# Patient Record
Sex: Male | Born: 1960 | Race: White | Hispanic: No | Marital: Married | Smoking: Former smoker
Health system: Southern US, Community
[De-identification: ages and names within clinical notes are randomized; demographics above are authoritative.]

## PROBLEM LIST (undated history)

## (undated) DIAGNOSIS — I10 Essential (primary) hypertension: Secondary | ICD-10-CM

## (undated) DIAGNOSIS — M109 Gout, unspecified: Secondary | ICD-10-CM

## (undated) DIAGNOSIS — I2510 Atherosclerotic heart disease of native coronary artery without angina pectoris: Secondary | ICD-10-CM

## (undated) DIAGNOSIS — E119 Type 2 diabetes mellitus without complications: Secondary | ICD-10-CM

---

## 2018-08-03 ENCOUNTER — Other Ambulatory Visit: Payer: Self-pay

## 2018-08-03 ENCOUNTER — Emergency Department (INDEPENDENT_AMBULATORY_CARE_PROVIDER_SITE_OTHER): Payer: 59

## 2018-08-03 ENCOUNTER — Emergency Department
Admission: EM | Admit: 2018-08-03 | Discharge: 2018-08-03 | Payer: 59 | Source: Home / Self Care | Attending: Family Medicine | Admitting: Family Medicine

## 2018-08-03 ENCOUNTER — Encounter: Payer: Self-pay | Admitting: Emergency Medicine

## 2018-08-03 DIAGNOSIS — S43034A Inferior dislocation of right humerus, initial encounter: Secondary | ICD-10-CM | POA: Diagnosis not present

## 2018-08-03 DIAGNOSIS — S42291A Other displaced fracture of upper end of right humerus, initial encounter for closed fracture: Secondary | ICD-10-CM

## 2018-08-03 DIAGNOSIS — M25511 Pain in right shoulder: Secondary | ICD-10-CM

## 2018-08-03 HISTORY — DX: Type 2 diabetes mellitus without complications: E11.9

## 2018-08-03 HISTORY — DX: Essential (primary) hypertension: I10

## 2018-08-03 HISTORY — DX: Gout, unspecified: M10.9

## 2018-08-03 HISTORY — DX: Atherosclerotic heart disease of native coronary artery without angina pectoris: I25.10

## 2018-08-03 MED ORDER — KETOROLAC TROMETHAMINE 60 MG/2ML IM SOLN
60.0000 mg | Freq: Once | INTRAMUSCULAR | Status: AC
  Administered 2018-08-03: 60 mg via INTRAMUSCULAR

## 2018-08-03 MED ORDER — GENERIC EXTERNAL MEDICATION
10.00 | Status: DC
Start: ? — End: 2018-08-03

## 2018-08-03 MED ORDER — KETOROLAC TROMETHAMINE 60 MG/2ML IM SOLN: 60.0000 mg | Freq: Once | INTRAMUSCULAR | Status: DC

## 2018-08-03 MED ORDER — SODIUM CHLORIDE 0.9 % IV SOLN
10.00 | INTRAVENOUS | Status: DC
Start: ? — End: 2018-08-03

## 2018-08-03 NOTE — ED Triage Notes (Signed)
Patient reports slipping and falling on hardwood floor in livingroom about one hour ago; landed on right shoulder; cannot tolerate lowering his arm and is holding it up over his head. This has never happened to him before. Patient is on a blood thinner and has concerns about this.

## 2018-08-03 NOTE — ED Provider Notes (Signed)
[facility]    CSN: [id number] Arrival date & time: 08/03/18  1344     History   Chief Complaint Chief Complaint  Patient presents with  . Shoulder Injury    HPI Marquavious Seacat is a 58 y.o. male.   Patient fell on a hardwood floor about one hour ago, landing on his right side with his right shoulder abducted, resulting in sudden right shoulder pain.  He has been unable to adduct his right shoulder, holding his right arm above horizontal.  He denies distal paresthesias.  The history is provided by the patient.  Shoulder Injury  This is a new problem. The current episode started 1 to 2 hours ago. The problem occurs constantly. The problem has not changed since onset.Pertinent negatives include no chest pain and no shortness of breath. Exacerbated by: right shoulder movement. Nothing relieves the symptoms. He has tried nothing for the symptoms.    Past Medical History:  Diagnosis Date  . Coronary artery disease   . Diabetes mellitus without complication (HCC)   . Gout   . Hypertension     There are no active problems to display for this patient.        Home Medications    Prior to Admission medications   Medication Sig Start Date End Date Taking? Authorizing Provider  allopurinol (ZYLOPRIM) 100 MG tablet Take 100 mg by mouth 3 (three) times daily as needed.   Yes [provider]  amLODipine (NORVASC) 5 MG tablet Take 5 mg by mouth daily.   Yes [provider]  atorvastatin (LIPITOR) 40 MG tablet Take 40 mg by mouth daily.   Yes [provider]  clopidogrel (PLAVIX) 75 MG tablet Take 75 mg by mouth daily.   Yes [provider]  lisinopril (PRINIVIL,ZESTRIL) 40 MG tablet Take 40 mg by mouth daily.   Yes [provider]  metFORMIN (GLUCOPHAGE) 500 MG tablet Take by mouth 2 (two) times daily with a meal.   Yes [provider]    Family History No family history on file.  Social History Social History    Tobacco Use  . Smoking status: Former Games developer  . Smokeless tobacco: Never Used  Substance Use Topics  . Alcohol use: Yes  . Drug use: Not on file     Allergies   Patient has no known allergies.   Review of Systems Review of Systems  Respiratory: Negative for shortness of breath.   Cardiovascular: Negative for chest pain.  Neurological: Negative for numbness.  All other systems reviewed and are negative.    Physical Exam Triage Vital Signs ED Triage Vitals  Enc Vitals Group     BP 08/03/18 1427 (!) 159/93     Pulse Rate 08/03/18 1427 82     Resp 08/03/18 1427 18     Temp 08/03/18 1427 98.6 F (37 C)     Temp Source 08/03/18 1427 Oral     SpO2 08/03/18 1427 97 %     Weight 08/03/18 1431 255 lb (115.7 kg)     Height 08/03/18 1428 6' (1.829 m)     Head Circumference --      Peak Flow --      Pain Score 08/03/18 1428 10     Pain Loc --      Pain Edu? --      Excl. in GC? --    No data found.  Updated Vital Signs BP (!) 159/93 (BP Location: Left Arm)   Pulse  82   Temp 98.6 F (37 C) (Oral)   Resp 18   Ht 6' (1.829 m)   Wt 115.7 kg   SpO2 97%   BMI 34.58 kg/m   Visual Acuity Right Eye Distance:   Left Eye Distance:   Bilateral Distance:    Right Eye Near:   Left Eye Near:    Bilateral Near:     Physical Exam Vitals signs and nursing note reviewed.  Constitutional:      General: He is not in acute distress. HENT:     Head: Normocephalic.     Right Ear: External ear normal.     Left Ear: External ear normal.     Nose: Nose normal.  Eyes:     Pupils: Pupils are equal, round, and reactive to light.  Neck:     Musculoskeletal: Normal range of motion.  Cardiovascular:     Rate and Rhythm: Normal rate.  Pulmonary:     Effort: Pulmonary effort is normal.  Musculoskeletal:     Right shoulder: He exhibits decreased range of motion, tenderness, bony tenderness and deformity. He exhibits no swelling and normal pulse.     Comments: Patient holding  his right arm abducted above horizontal. Distal neurovascular function is intact in right arm.  Skin:    General: Skin is warm and dry.  Neurological:     General: No focal deficit present.     Mental Status: He is alert.      UC Treatments / Results  Labs (all labs ordered are listed, but only abnormal results are displayed) Labs Reviewed - No data to display  EKG None  Radiology Dg Shoulder Right  Result Date: 08/03/2018 CLINICAL DATA:  Fall 1 hour ago with right shoulder pain, initial encounter EXAM: RIGHT SHOULDER - 2+ VIEW COMPARISON:  None. FINDINGS: Anterior inferior dislocation of the right shoulder is noted. There is an apparent Hill-Sachs deformity in the superior aspect of the proximal right humerus. No other bony abnormality is seen. IMPRESSION: Anterior inferior dislocation of the right shoulder with Hill-Sachs deformity. Electronically Signed   By: Alcide Clever M.D.   On: 08/03/2018 15:02    Procedures Procedures (including critical care time)  Medications Ordered in UC Medications  ketorolac (TORADOL) injection 60 mg (has no administration in time range)  ketorolac (TORADOL) injection 60 mg (60 mg Intramuscular Given 08/03/18 1421)    Initial Impression / Assessment and Plan / UC Course  I have reviewed the triage vital signs and the nursing notes.  Pertinent labs & imaging results that were available during my care of the patient were reviewed by me and considered in my medical decision making (see chart for details).    Because of presence of humeral head fracture (Hill-Sachs deformity), will refer patient to [facility] ED for orthopedic evaluation.   Final Clinical Impressions(s) / UC Diagnoses   Final diagnoses:  Closed inferior dislocation of right shoulder, initial encounter  Closed Hill-Sachs fracture of right humerus, initial encounter   Discharge Instructions   None    ED Prescriptions    None         Lattie Haw, MD 08/03/18 1545

## 2019-12-23 IMAGING — DX DG SHOULDER 2+V*R*
3 series · 3 of 3 positions shown · non-contrast
Comparison: None.

CLINICAL DATA: Fall 1 hour ago with right shoulder pain, initial
encounter

EXAM:
RIGHT SHOULDER - 2+ VIEW

[shoulder grashey]
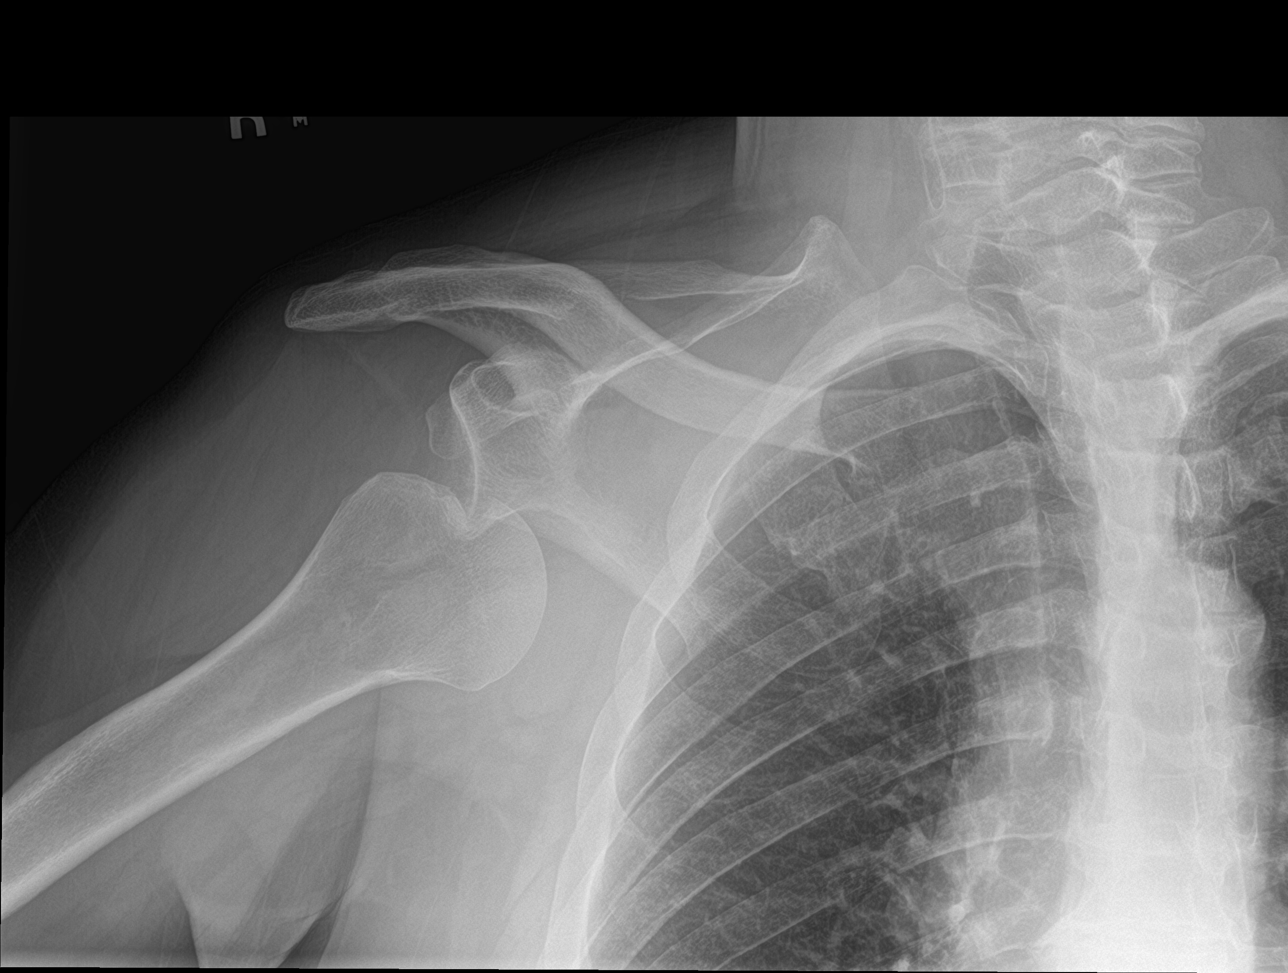

[shoulder y view]
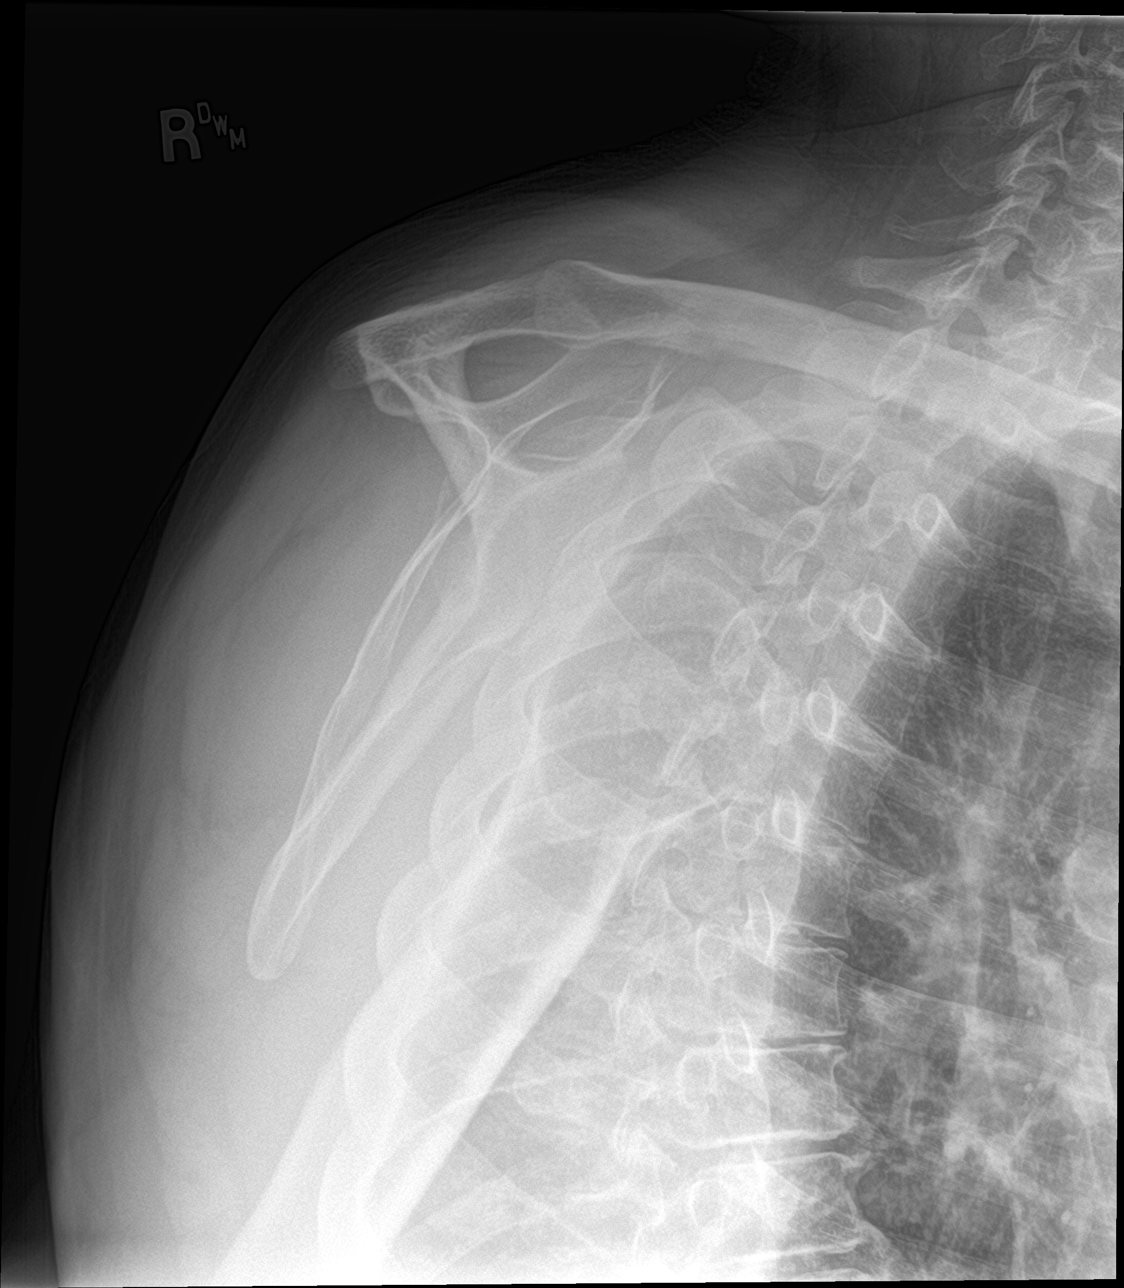

[shoulder axillary]
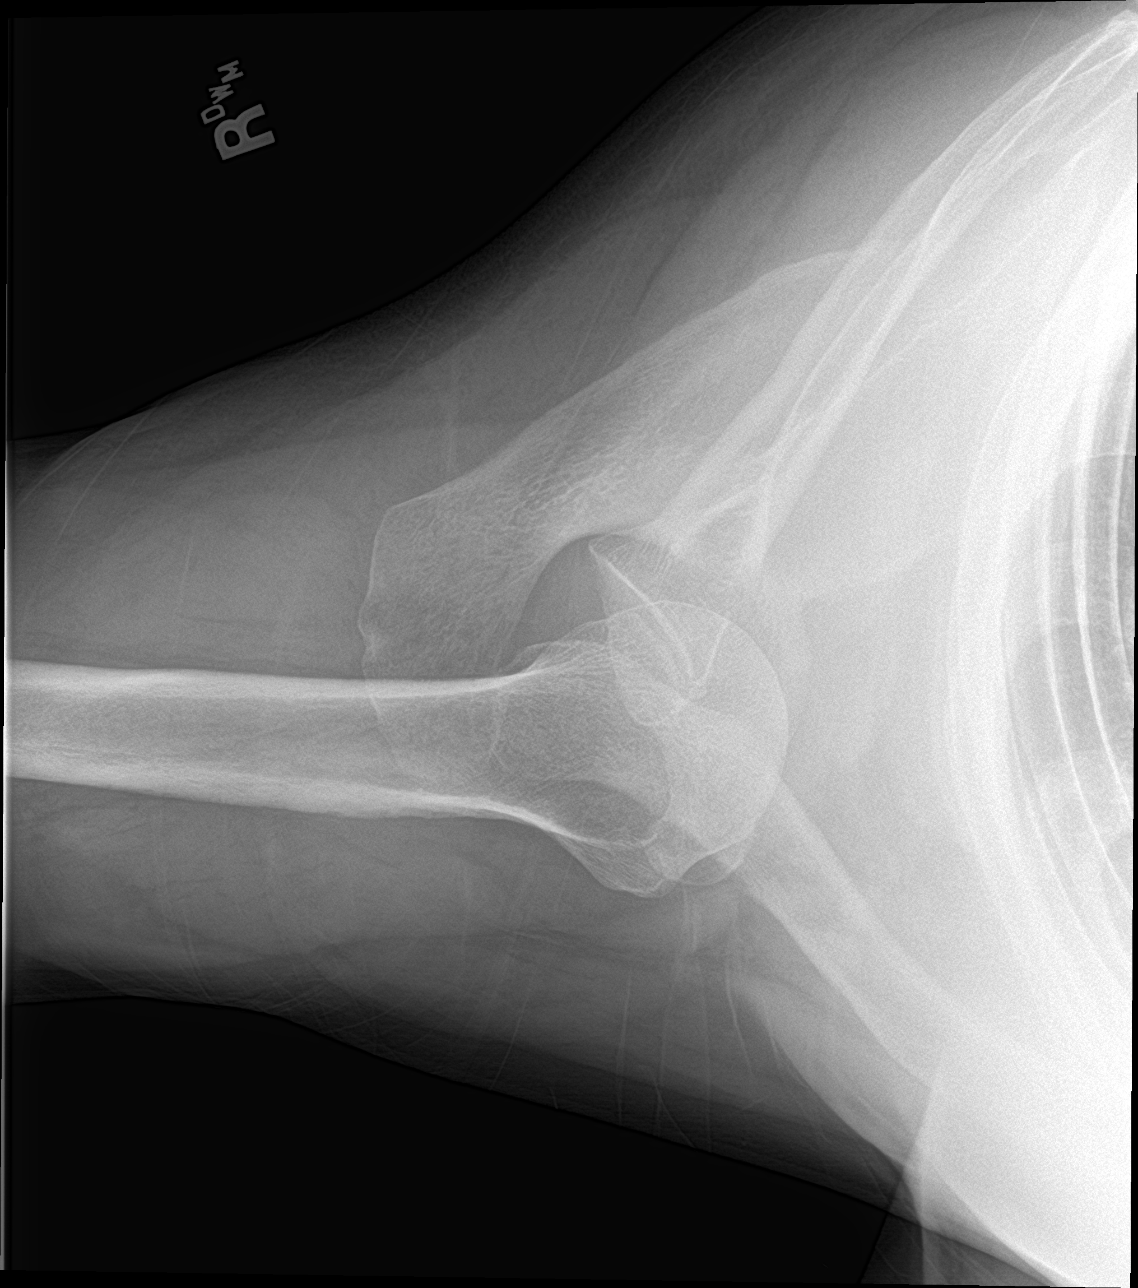

[3 of 3 positions shown; findings below may reference images not displayed]

FINDINGS: Anterior inferior dislocation of the right shoulder is noted. There
is an apparent Hill-Sachs deformity in the superior aspect of the
proximal right humerus. No other bony abnormality is seen.
IMPRESSION: Anterior inferior dislocation of the right shoulder with Hill-Sachs
deformity.
